# Patient Record
Sex: Male | Born: 1935 | Race: Black or African American | Hispanic: No | Marital: Married
Health system: Southern US, Community
[De-identification: ages and names within clinical notes are randomized; demographics above are authoritative.]

---

## 2005-09-25 ENCOUNTER — Other Ambulatory Visit: Payer: Self-pay

## 2005-09-25 ENCOUNTER — Observation Stay: Payer: Self-pay | Admitting: Internal Medicine

## 2005-09-26 ENCOUNTER — Other Ambulatory Visit: Payer: Self-pay

## 2005-10-06 ENCOUNTER — Emergency Department: Payer: Self-pay | Admitting: Emergency Medicine

## 2006-08-23 ENCOUNTER — Emergency Department: Payer: Self-pay | Admitting: Emergency Medicine

## 2006-08-23 ENCOUNTER — Other Ambulatory Visit: Payer: Self-pay

## 2008-02-07 ENCOUNTER — Inpatient Hospital Stay: Payer: Self-pay | Admitting: Internal Medicine

## 2008-02-07 ENCOUNTER — Other Ambulatory Visit: Payer: Self-pay

## 2008-03-24 ENCOUNTER — Ambulatory Visit: Payer: Self-pay | Admitting: Family Medicine

## 2009-07-15 ENCOUNTER — Ambulatory Visit: Payer: Self-pay | Admitting: Family Medicine

## 2010-01-21 ENCOUNTER — Inpatient Hospital Stay: Payer: Self-pay | Admitting: Specialist

## 2010-07-09 ENCOUNTER — Ambulatory Visit: Payer: Self-pay | Admitting: Family Medicine

## 2010-12-14 ENCOUNTER — Inpatient Hospital Stay: Payer: Self-pay | Admitting: Internal Medicine

## 2012-03-06 ENCOUNTER — Ambulatory Visit: Payer: Self-pay | Admitting: Family Medicine

## 2012-04-13 ENCOUNTER — Inpatient Hospital Stay: Payer: Self-pay | Admitting: Internal Medicine

## 2012-04-13 LAB — TROPONIN I: Troponin-I: 0.07 ng/mL — ABNORMAL HIGH

## 2012-04-13 LAB — URINALYSIS, COMPLETE
Bacteria: NONE SEEN
Bilirubin,UR: NEGATIVE
Nitrite: NEGATIVE
RBC,UR: 1092 /HPF (ref 0–5)
Specific Gravity: 1.022 (ref 1.003–1.030)
Squamous Epithelial: NONE SEEN
WBC UR: 42 /HPF (ref 0–5)

## 2012-04-13 LAB — COMPREHENSIVE METABOLIC PANEL
Albumin: 3 g/dL — ABNORMAL LOW (ref 3.4–5.0)
Anion Gap: 11 (ref 7–16)
Calcium, Total: 10.2 mg/dL — ABNORMAL HIGH (ref 8.5–10.1)
Chloride: 109 mmol/L — ABNORMAL HIGH (ref 98–107)
Co2: 29 mmol/L (ref 21–32)
EGFR (Non-African Amer.): 16 — ABNORMAL LOW
Glucose: 581 mg/dL (ref 65–99)
Osmolality: 350 (ref 275–301)
Potassium: 4.3 mmol/L (ref 3.5–5.1)
Sodium: 149 mmol/L — ABNORMAL HIGH (ref 136–145)
Total Protein: 7.8 g/dL (ref 6.4–8.2)

## 2012-04-13 LAB — CBC
HCT: 51.5 % (ref 40.0–52.0)
MCH: 26.9 pg (ref 26.0–34.0)
MCHC: 29.1 g/dL — ABNORMAL LOW (ref 32.0–36.0)
MCV: 92 fL (ref 80–100)
RBC: 5.57 10*6/uL (ref 4.40–5.90)
RDW: 14.2 % (ref 11.5–14.5)

## 2012-04-13 LAB — BASIC METABOLIC PANEL
Anion Gap: 8 (ref 7–16)
Calcium, Total: 9.6 mg/dL (ref 8.5–10.1)
Chloride: 114 mmol/L — ABNORMAL HIGH (ref 98–107)
EGFR (African American): 22 — ABNORMAL LOW
EGFR (Non-African Amer.): 19 — ABNORMAL LOW
Potassium: 4 mmol/L (ref 3.5–5.1)
Sodium: 152 mmol/L — ABNORMAL HIGH (ref 136–145)

## 2012-04-13 LAB — CK TOTAL AND CKMB (NOT AT ARMC): CK, Total: 208 U/L (ref 35–232)

## 2012-04-14 LAB — CBC WITH DIFFERENTIAL/PLATELET
Basophil #: 0.1 10*3/uL (ref 0.0–0.1)
Basophil %: 0.9 %
Eosinophil #: 0 10*3/uL (ref 0.0–0.7)
Eosinophil %: 0.2 %
MCHC: 31 g/dL — ABNORMAL LOW (ref 32.0–36.0)
MCV: 90 fL (ref 80–100)
Monocyte #: 1.1 x10 3/mm — ABNORMAL HIGH (ref 0.2–1.0)
Monocyte %: 7.7 %
Neutrophil #: 10.7 10*3/uL — ABNORMAL HIGH (ref 1.4–6.5)
Neutrophil %: 76.8 %
Platelet: 92 10*3/uL — ABNORMAL LOW (ref 150–440)
RBC: 4.96 10*6/uL (ref 4.40–5.90)
RDW: 13.8 % (ref 11.5–14.5)
WBC: 14 10*3/uL — ABNORMAL HIGH (ref 3.8–10.6)

## 2012-04-14 LAB — BASIC METABOLIC PANEL
Calcium, Total: 9.2 mg/dL (ref 8.5–10.1)
Chloride: 117 mmol/L — ABNORMAL HIGH (ref 98–107)
Creatinine: 2.84 mg/dL — ABNORMAL HIGH (ref 0.60–1.30)
Glucose: 217 mg/dL — ABNORMAL HIGH (ref 65–99)
Osmolality: 342 (ref 275–301)
Sodium: 157 mmol/L — ABNORMAL HIGH (ref 136–145)

## 2012-04-14 LAB — CK TOTAL AND CKMB (NOT AT ARMC): CK, Total: 254 U/L — ABNORMAL HIGH (ref 35–232)

## 2012-04-14 LAB — MAGNESIUM: Magnesium: 2.2 mg/dL

## 2012-04-14 LAB — HEMOGLOBIN A1C: Hemoglobin A1C: 9 % — ABNORMAL HIGH (ref 4.2–6.3)

## 2012-04-14 LAB — LIPID PANEL: Cholesterol: 141 mg/dL (ref 0–200)

## 2012-04-14 LAB — TROPONIN I: Troponin-I: 0.09 ng/mL — ABNORMAL HIGH

## 2012-04-14 LAB — URINE CULTURE

## 2012-04-15 LAB — BASIC METABOLIC PANEL
Anion Gap: 9 (ref 7–16)
BUN: 75 mg/dL — ABNORMAL HIGH (ref 7–18)
Calcium, Total: 8.6 mg/dL (ref 8.5–10.1)
Co2: 26 mmol/L (ref 21–32)
Creatinine: 2.19 mg/dL — ABNORMAL HIGH (ref 0.60–1.30)
EGFR (African American): 31 — ABNORMAL LOW
Glucose: 136 mg/dL — ABNORMAL HIGH (ref 65–99)
Sodium: 148 mmol/L — ABNORMAL HIGH (ref 136–145)

## 2012-04-16 LAB — BASIC METABOLIC PANEL
Anion Gap: 10 (ref 7–16)
BUN: 66 mg/dL — ABNORMAL HIGH (ref 7–18)
Calcium, Total: 9.1 mg/dL (ref 8.5–10.1)
Co2: 28 mmol/L (ref 21–32)
Creatinine: 2.03 mg/dL — ABNORMAL HIGH (ref 0.60–1.30)
EGFR (African American): 34 — ABNORMAL LOW
Glucose: 187 mg/dL — ABNORMAL HIGH (ref 65–99)
Osmolality: 320 (ref 275–301)
Potassium: 3.9 mmol/L (ref 3.5–5.1)
Sodium: 149 mmol/L — ABNORMAL HIGH (ref 136–145)

## 2012-04-17 LAB — CBC WITH DIFFERENTIAL/PLATELET
Basophil #: 0 10*3/uL (ref 0.0–0.1)
Basophil %: 0.2 %
Eosinophil #: 0.4 10*3/uL (ref 0.0–0.7)
Eosinophil %: 4.9 %
HCT: 35.8 % — ABNORMAL LOW (ref 40.0–52.0)
Lymphocyte #: 2.1 10*3/uL (ref 1.0–3.6)
Lymphocyte %: 28.7 %
MCH: 28.1 pg (ref 26.0–34.0)
MCHC: 31.7 g/dL — ABNORMAL LOW (ref 32.0–36.0)
Monocyte #: 0.6 x10 3/mm (ref 0.2–1.0)
Monocyte %: 8.3 %
Neutrophil #: 4.2 10*3/uL (ref 1.4–6.5)
RDW: 13.7 % (ref 11.5–14.5)
WBC: 7.3 10*3/uL (ref 3.8–10.6)

## 2012-04-17 LAB — BASIC METABOLIC PANEL
Anion Gap: 9 (ref 7–16)
BUN: 57 mg/dL — ABNORMAL HIGH (ref 7–18)
Calcium, Total: 8.5 mg/dL (ref 8.5–10.1)
Chloride: 112 mmol/L — ABNORMAL HIGH (ref 98–107)
Co2: 26 mmol/L (ref 21–32)
Creatinine: 1.78 mg/dL — ABNORMAL HIGH (ref 0.60–1.30)
EGFR (Non-African Amer.): 35 — ABNORMAL LOW
Osmolality: 319 (ref 275–301)

## 2012-04-18 LAB — BASIC METABOLIC PANEL
Anion Gap: 7 (ref 7–16)
BUN: 42 mg/dL — ABNORMAL HIGH (ref 7–18)
Calcium, Total: 8.8 mg/dL (ref 8.5–10.1)
Chloride: 112 mmol/L — ABNORMAL HIGH (ref 98–107)
Co2: 25 mmol/L (ref 21–32)
Creatinine: 1.65 mg/dL — ABNORMAL HIGH (ref 0.60–1.30)
EGFR (African American): 44 — ABNORMAL LOW
EGFR (Non-African Amer.): 38 — ABNORMAL LOW
Potassium: 4.6 mmol/L (ref 3.5–5.1)

## 2012-04-23 ENCOUNTER — Emergency Department: Payer: Self-pay | Admitting: Emergency Medicine

## 2012-04-23 LAB — CBC WITH DIFFERENTIAL/PLATELET
Basophil #: 0 10*3/uL (ref 0.0–0.1)
Eosinophil %: 3 %
HGB: 13.5 g/dL (ref 13.0–18.0)
Lymphocyte #: 1.8 10*3/uL (ref 1.0–3.6)
MCH: 28.5 pg (ref 26.0–34.0)
Monocyte #: 0.7 x10 3/mm (ref 0.2–1.0)
Neutrophil %: 64 %
Platelet: 128 10*3/uL — ABNORMAL LOW (ref 150–440)
RBC: 4.74 10*6/uL (ref 4.40–5.90)

## 2012-04-23 LAB — COMPREHENSIVE METABOLIC PANEL
Alkaline Phosphatase: 124 U/L (ref 50–136)
Anion Gap: 9 (ref 7–16)
BUN: 23 mg/dL — ABNORMAL HIGH (ref 7–18)
Calcium, Total: 9.6 mg/dL (ref 8.5–10.1)
Chloride: 102 mmol/L (ref 98–107)
EGFR (African American): 50 — ABNORMAL LOW
Potassium: 4.6 mmol/L (ref 3.5–5.1)
SGOT(AST): 27 U/L (ref 15–37)
Total Protein: 7.2 g/dL (ref 6.4–8.2)

## 2012-04-23 LAB — URINALYSIS, COMPLETE
Blood: NEGATIVE
Glucose,UR: 50 mg/dL (ref 0–75)
Ketone: NEGATIVE
Leukocyte Esterase: NEGATIVE
Protein: NEGATIVE
RBC,UR: <1 /HPF (ref 0–5)
Specific Gravity: 1.006 (ref 1.003–1.030)
Squamous Epithelial: <1

## 2012-04-23 LAB — CK TOTAL AND CKMB (NOT AT ARMC)
CK, Total: 57 U/L (ref 35–232)
CK-MB: 1.9 ng/mL (ref 0.5–3.6)

## 2012-04-29 LAB — CULTURE, BLOOD (SINGLE)

## 2012-05-02 ENCOUNTER — Inpatient Hospital Stay: Payer: Self-pay | Admitting: Internal Medicine

## 2012-05-02 LAB — URINALYSIS, COMPLETE
Bilirubin,UR: NEGATIVE
Ketone: NEGATIVE
Ph: 5 (ref 4.5–8.0)
Protein: 100
RBC,UR: <1 /HPF (ref 0–5)
Specific Gravity: 1.018 (ref 1.003–1.030)
Squamous Epithelial: NONE SEEN

## 2012-05-02 LAB — COMPREHENSIVE METABOLIC PANEL
Albumin: 2.5 g/dL — ABNORMAL LOW (ref 3.4–5.0)
Alkaline Phosphatase: 131 U/L (ref 50–136)
BUN: 20 mg/dL — ABNORMAL HIGH (ref 7–18)
Calcium, Total: 10.2 mg/dL — ABNORMAL HIGH (ref 8.5–10.1)
Chloride: 108 mmol/L — ABNORMAL HIGH (ref 98–107)
Co2: 24 mmol/L (ref 21–32)
Co2: 27 mmol/L (ref 21–32)
Creatinine: 1.45 mg/dL — ABNORMAL HIGH (ref 0.60–1.30)
Creatinine: 1.57 mg/dL — ABNORMAL HIGH (ref 0.60–1.30)
EGFR (African American): 51 — ABNORMAL LOW
EGFR (African American): 47 — ABNORMAL LOW
EGFR (Non-African Amer.): 40 — ABNORMAL LOW
Glucose: 90 mg/dL (ref 65–99)
Glucose: 40 mg/dL — CL (ref 65–99)
Osmolality: 279 (ref 275–301)
Potassium: 5.3 mmol/L — ABNORMAL HIGH (ref 3.5–5.1)
Potassium: 6 mmol/L — ABNORMAL HIGH (ref 3.5–5.1)
SGOT(AST): 32 U/L (ref 15–37)
SGPT (ALT): 19 U/L
Sodium: 141 mmol/L (ref 136–145)
Total Protein: 7.5 g/dL (ref 6.4–8.2)
Total Protein: 7 g/dL (ref 6.4–8.2)

## 2012-05-02 LAB — CBC
HGB: 13.8 g/dL (ref 13.0–18.0)
MCH: 28.3 pg (ref 26.0–34.0)
WBC: 17.3 10*3/uL — ABNORMAL HIGH (ref 3.8–10.6)

## 2012-05-02 LAB — PROTIME-INR
INR: 0.9
Prothrombin Time: 12.9 secs (ref 11.5–14.7)

## 2012-05-02 LAB — APTT: Activated PTT: 28.9 secs (ref 23.6–35.9)

## 2012-05-03 LAB — BASIC METABOLIC PANEL
Anion Gap: 8 (ref 7–16)
Chloride: 108 mmol/L — ABNORMAL HIGH (ref 98–107)
Co2: 28 mmol/L (ref 21–32)
Creatinine: 1.81 mg/dL — ABNORMAL HIGH (ref 0.60–1.30)
EGFR (African American): 39 — ABNORMAL LOW
Osmolality: 292 (ref 275–301)
Potassium: 4.6 mmol/L (ref 3.5–5.1)
Sodium: 144 mmol/L (ref 136–145)

## 2012-05-03 LAB — CBC WITH DIFFERENTIAL/PLATELET
Basophil #: 0 10*3/uL (ref 0.0–0.1)
Basophil %: 0.2 %
Eosinophil #: 0.3 10*3/uL (ref 0.0–0.7)
HCT: 40.9 % (ref 40.0–52.0)
Lymphocyte #: 1 10*3/uL (ref 1.0–3.6)
MCH: 28.7 pg (ref 26.0–34.0)
MCHC: 32.1 g/dL (ref 32.0–36.0)
MCV: 89 fL (ref 80–100)
Monocyte #: 0.6 x10 3/mm (ref 0.2–1.0)
Neutrophil #: 11 10*3/uL — ABNORMAL HIGH (ref 1.4–6.5)
Neutrophil %: 85.2 %
RBC: 4.58 10*6/uL (ref 4.40–5.90)
RDW: 14.3 % (ref 11.5–14.5)

## 2012-05-04 LAB — BASIC METABOLIC PANEL
BUN: 29 mg/dL — ABNORMAL HIGH (ref 7–18)
Calcium, Total: 9.3 mg/dL (ref 8.5–10.1)
Creatinine: 1.99 mg/dL — ABNORMAL HIGH (ref 0.60–1.30)
EGFR (Non-African Amer.): 30 — ABNORMAL LOW
Osmolality: 291 (ref 275–301)
Potassium: 4.8 mmol/L (ref 3.5–5.1)
Sodium: 142 mmol/L (ref 136–145)

## 2012-05-04 LAB — CBC WITH DIFFERENTIAL/PLATELET
Basophil %: 0.4 %
Eosinophil #: 1 10*3/uL — ABNORMAL HIGH (ref 0.0–0.7)
HCT: 37.6 % — ABNORMAL LOW (ref 40.0–52.0)
HGB: 11.6 g/dL — ABNORMAL LOW (ref 13.0–18.0)
Lymphocyte %: 13.1 %
MCH: 27.9 pg (ref 26.0–34.0)
MCHC: 31 g/dL — ABNORMAL LOW (ref 32.0–36.0)
MCV: 90 fL (ref 80–100)
Monocyte #: 0.9 x10 3/mm (ref 0.2–1.0)
Monocyte %: 7.7 %
RBC: 4.17 10*6/uL — ABNORMAL LOW (ref 4.40–5.90)

## 2012-05-06 LAB — BASIC METABOLIC PANEL
Anion Gap: 8 (ref 7–16)
BUN: 20 mg/dL — ABNORMAL HIGH (ref 7–18)
Chloride: 116 mmol/L — ABNORMAL HIGH (ref 98–107)
EGFR (African American): 44 — ABNORMAL LOW
EGFR (Non-African Amer.): 38 — ABNORMAL LOW
Glucose: 132 mg/dL — ABNORMAL HIGH (ref 65–99)
Osmolality: 301 (ref 275–301)
Sodium: 149 mmol/L — ABNORMAL HIGH (ref 136–145)

## 2012-05-06 LAB — CBC WITH DIFFERENTIAL/PLATELET
Basophil #: 0 10*3/uL (ref 0.0–0.1)
Basophil %: 0.4 %
HCT: 34.5 % — ABNORMAL LOW (ref 40.0–52.0)
Lymphocyte %: 19.8 %
MCH: 28.3 pg (ref 26.0–34.0)
MCHC: 31.7 g/dL — ABNORMAL LOW (ref 32.0–36.0)
MCV: 89 fL (ref 80–100)
Neutrophil #: 3.9 10*3/uL (ref 1.4–6.5)
Neutrophil %: 55.1 %
RBC: 3.86 10*6/uL — ABNORMAL LOW (ref 4.40–5.90)
RDW: 14.2 % (ref 11.5–14.5)
WBC: 7.2 10*3/uL (ref 3.8–10.6)

## 2012-05-07 LAB — BASIC METABOLIC PANEL
Anion Gap: 7 (ref 7–16)
BUN: 15 mg/dL (ref 7–18)
Calcium, Total: 8.8 mg/dL (ref 8.5–10.1)
Chloride: 112 mmol/L — ABNORMAL HIGH (ref 98–107)
EGFR (African American): 53 — ABNORMAL LOW
EGFR (Non-African Amer.): 46 — ABNORMAL LOW
Glucose: 107 mg/dL — ABNORMAL HIGH (ref 65–99)
Osmolality: 288 (ref 275–301)

## 2012-05-08 LAB — CULTURE, BLOOD (SINGLE)

## 2012-09-21 DEATH — deceased

## 2014-02-01 IMAGING — CT CT HEAD WITHOUT CONTRAST
2 series · 15 of 30 positions shown, 19 images · non-contrast
Comparison: none

REASON FOR EXAM: answering questions slowly
COMMENTS:

[Series 2: without · axial · non-contrast · 0.44mm/px · z∈[-141,-21]mm · 13 of 30 slices shown, 17 images]
[im 3/30  brain]
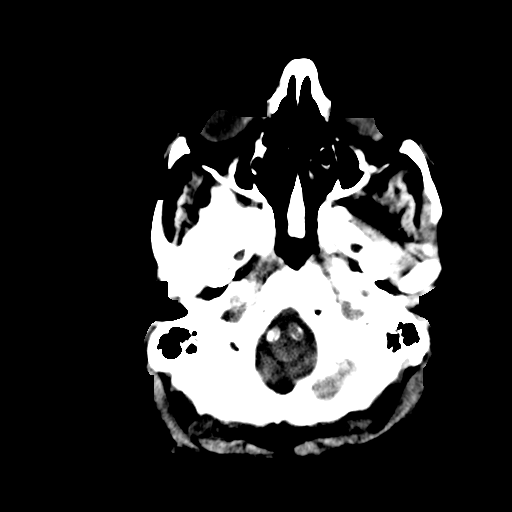
[im 3/30  bone]
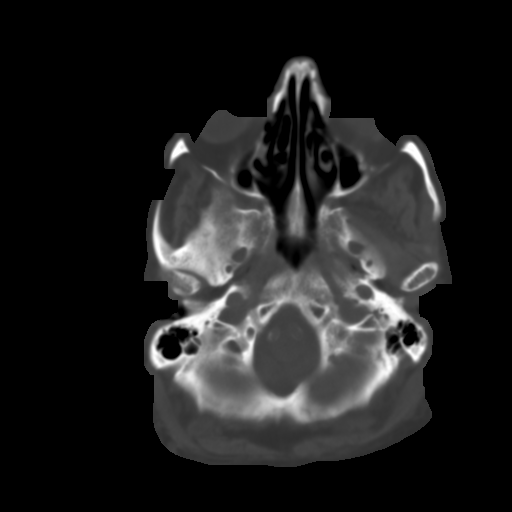
[im 5/30  brain]
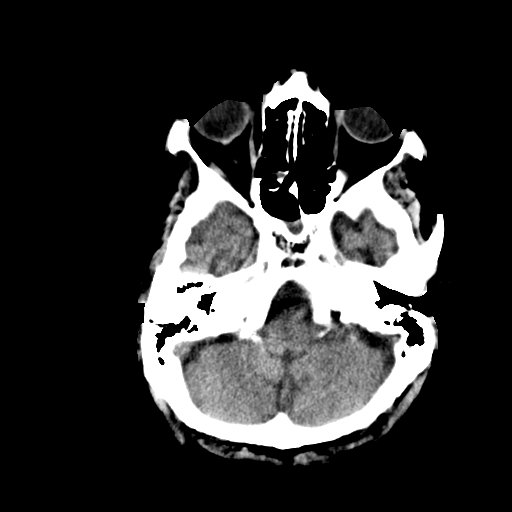
[im 7/30  brain]
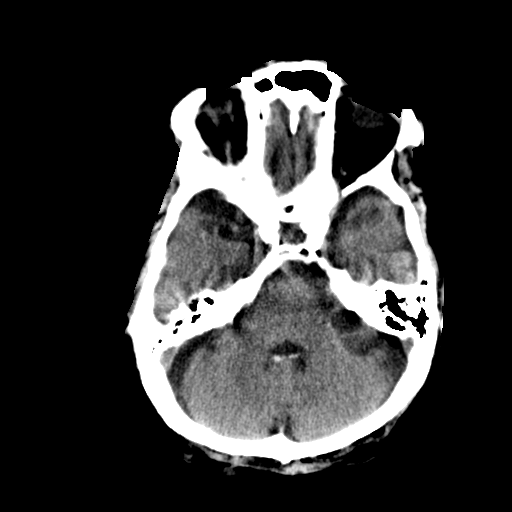
[im 9/30  brain]
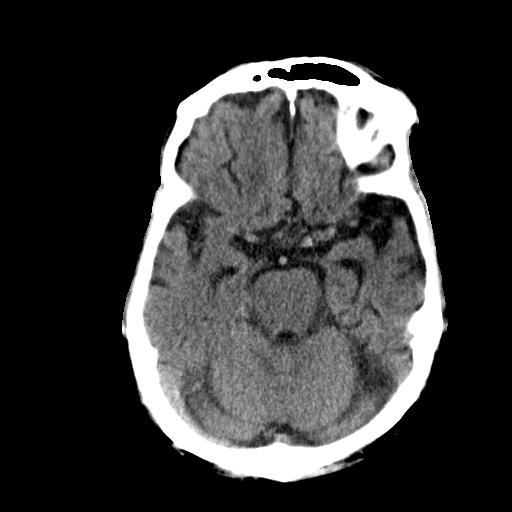
[im 11/30  brain]
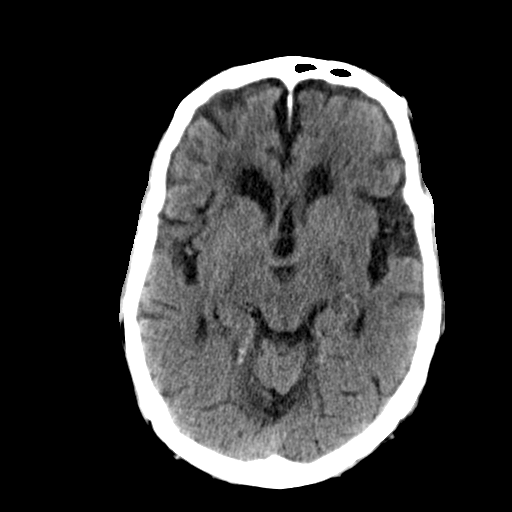
[im 11/30  bone]
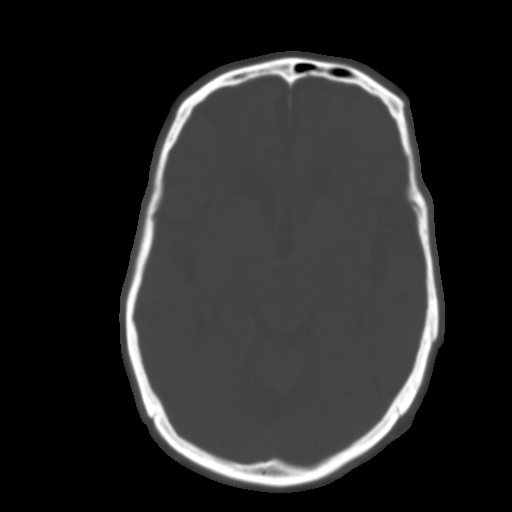
[im 13/30  brain]
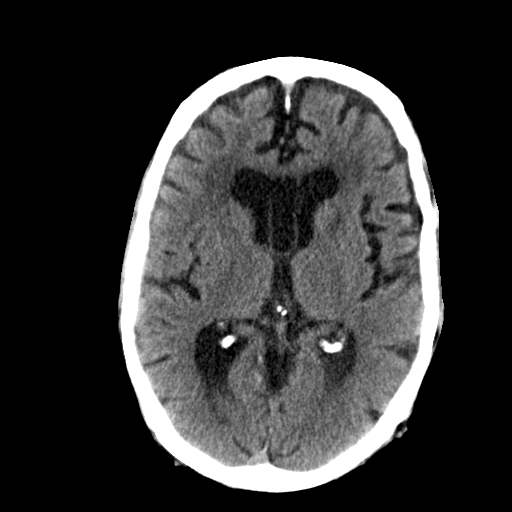
[im 15/30  brain]
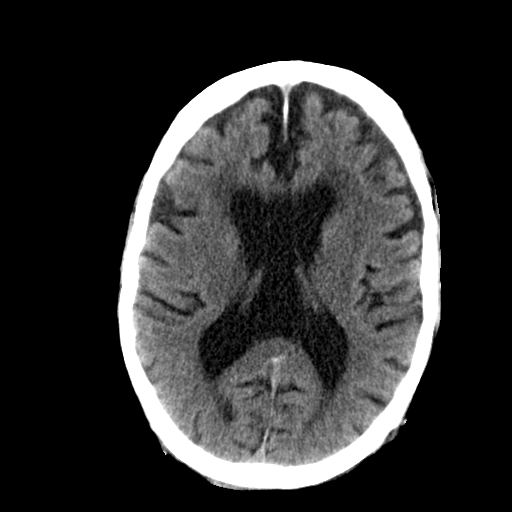
[im 17/30  brain]
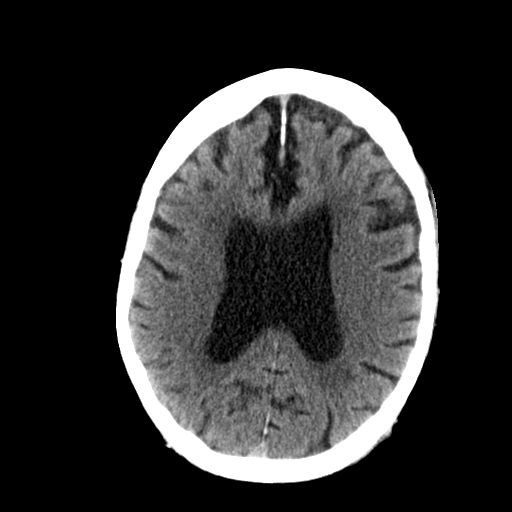
[im 19/30  brain]
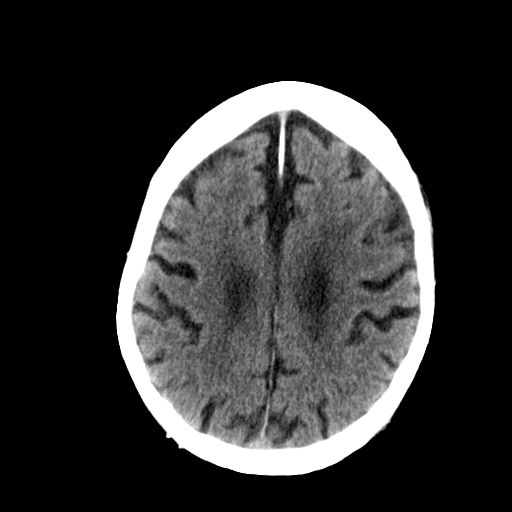
[im 19/30  bone]
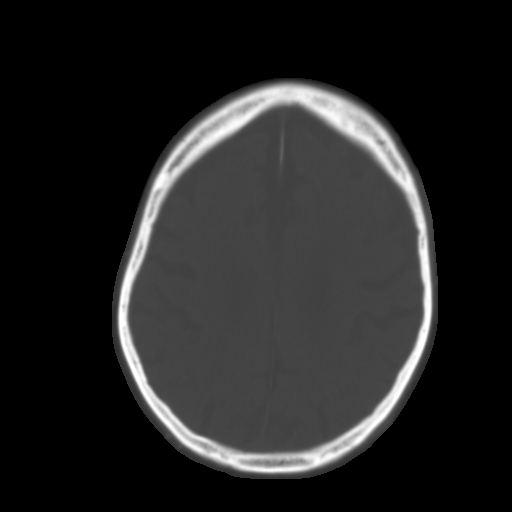
[im 21/30  brain]
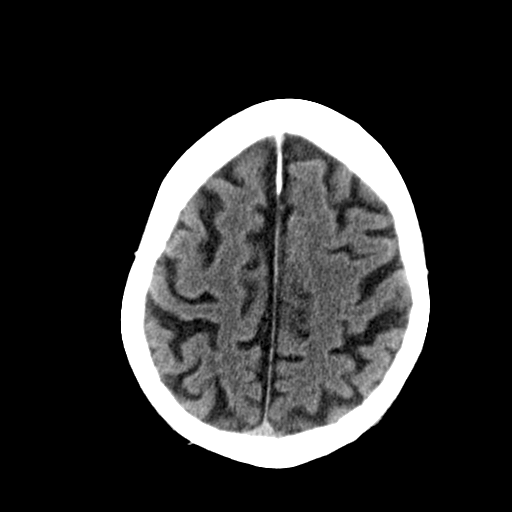
[im 23/30  brain]
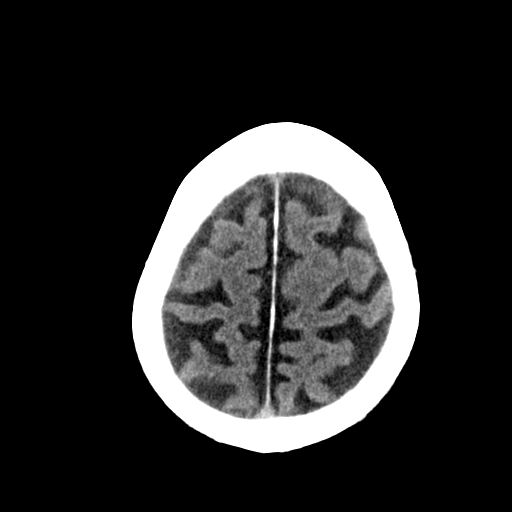
[im 25/30  brain]
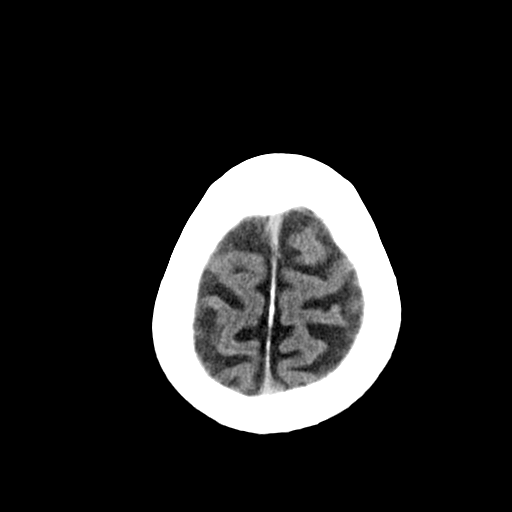
[im 27/30  brain]
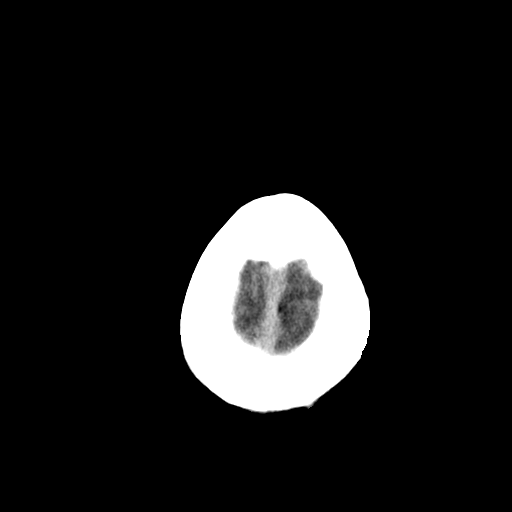
[im 27/30  bone]
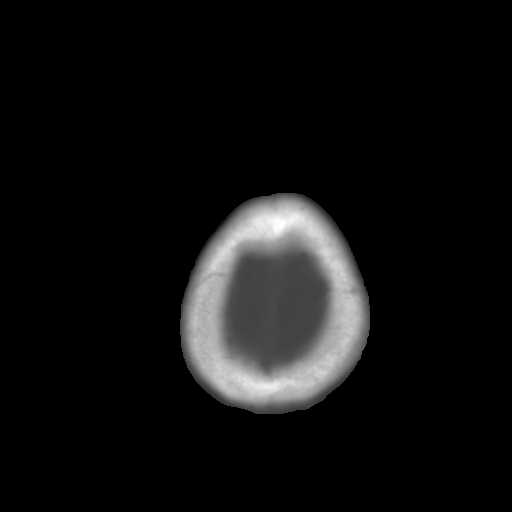

[Series 3: bone · axial · 0.44mm/px · z∈[-141,-121]mm · 2 of 30 slices shown]
[im 3/30  bone]
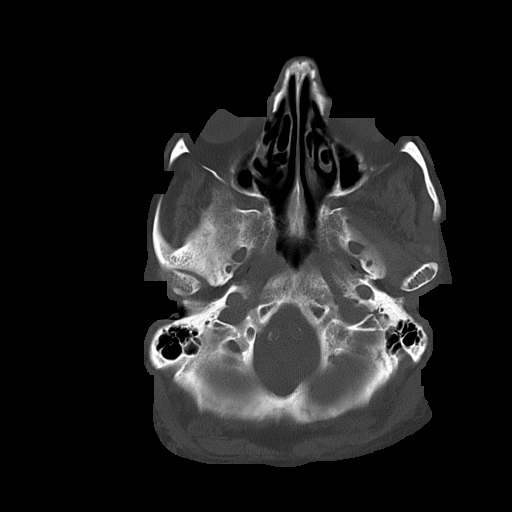
[im 7/30  bone]
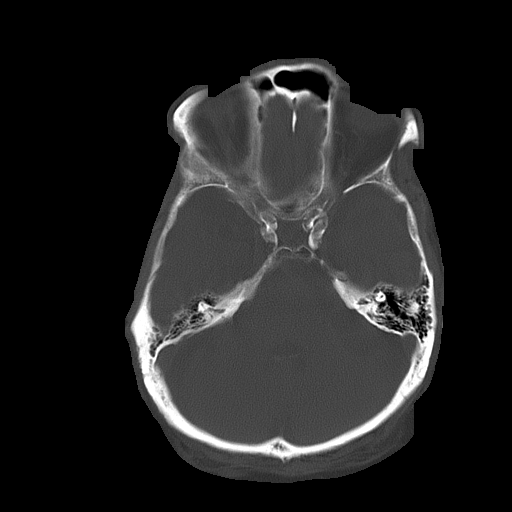

[15 of 30 positions shown; findings below may reference images not displayed]

PROCEDURE:     CT  - CT HEAD WITHOUT CONTRAST  - April 23, 2012  [DATE]

RESULT:     Axial noncontrast CT scanning was performed due to brain with
reconstructions at 5 mm intervals and slice thicknesses. Comparison is made
to study January 21, 2010.

There is moderate diffuse cerebral and cerebellar atrophy with compensatory
ventriculomegaly. There is a cavum septum pellucidum. There is no shift of
the midline. There is no evidence of an intracranial hemorrhage nor of an
evolving ischemic event.

At bone window settings there is fluid in a left sphenoid sinus cell. I see
no fluid elsewhere within the visualized portions of the sinuses. On the
previous study there is a small amount of fluid in the right sphenoid sinus
cell. The mastoid air cells are well pneumatized. There is no evidence of an
acute skull fracture.
IMPRESSION: 1. There are chronic atrophic changes within the brain. There is no evidence
of an acute ischemic or hemorrhagic event.
2. There is new fluid in a left sphenoid sinus cell which may reflect acute
sinusitis.

[REDACTED]

## 2014-02-12 IMAGING — CR DG CHEST 1V PORT
1 series · 1 of 1 positions shown · non-contrast
Comparison: none

REASON FOR EXAM: aspiration pneumonia
COMMENTS:

[portable]
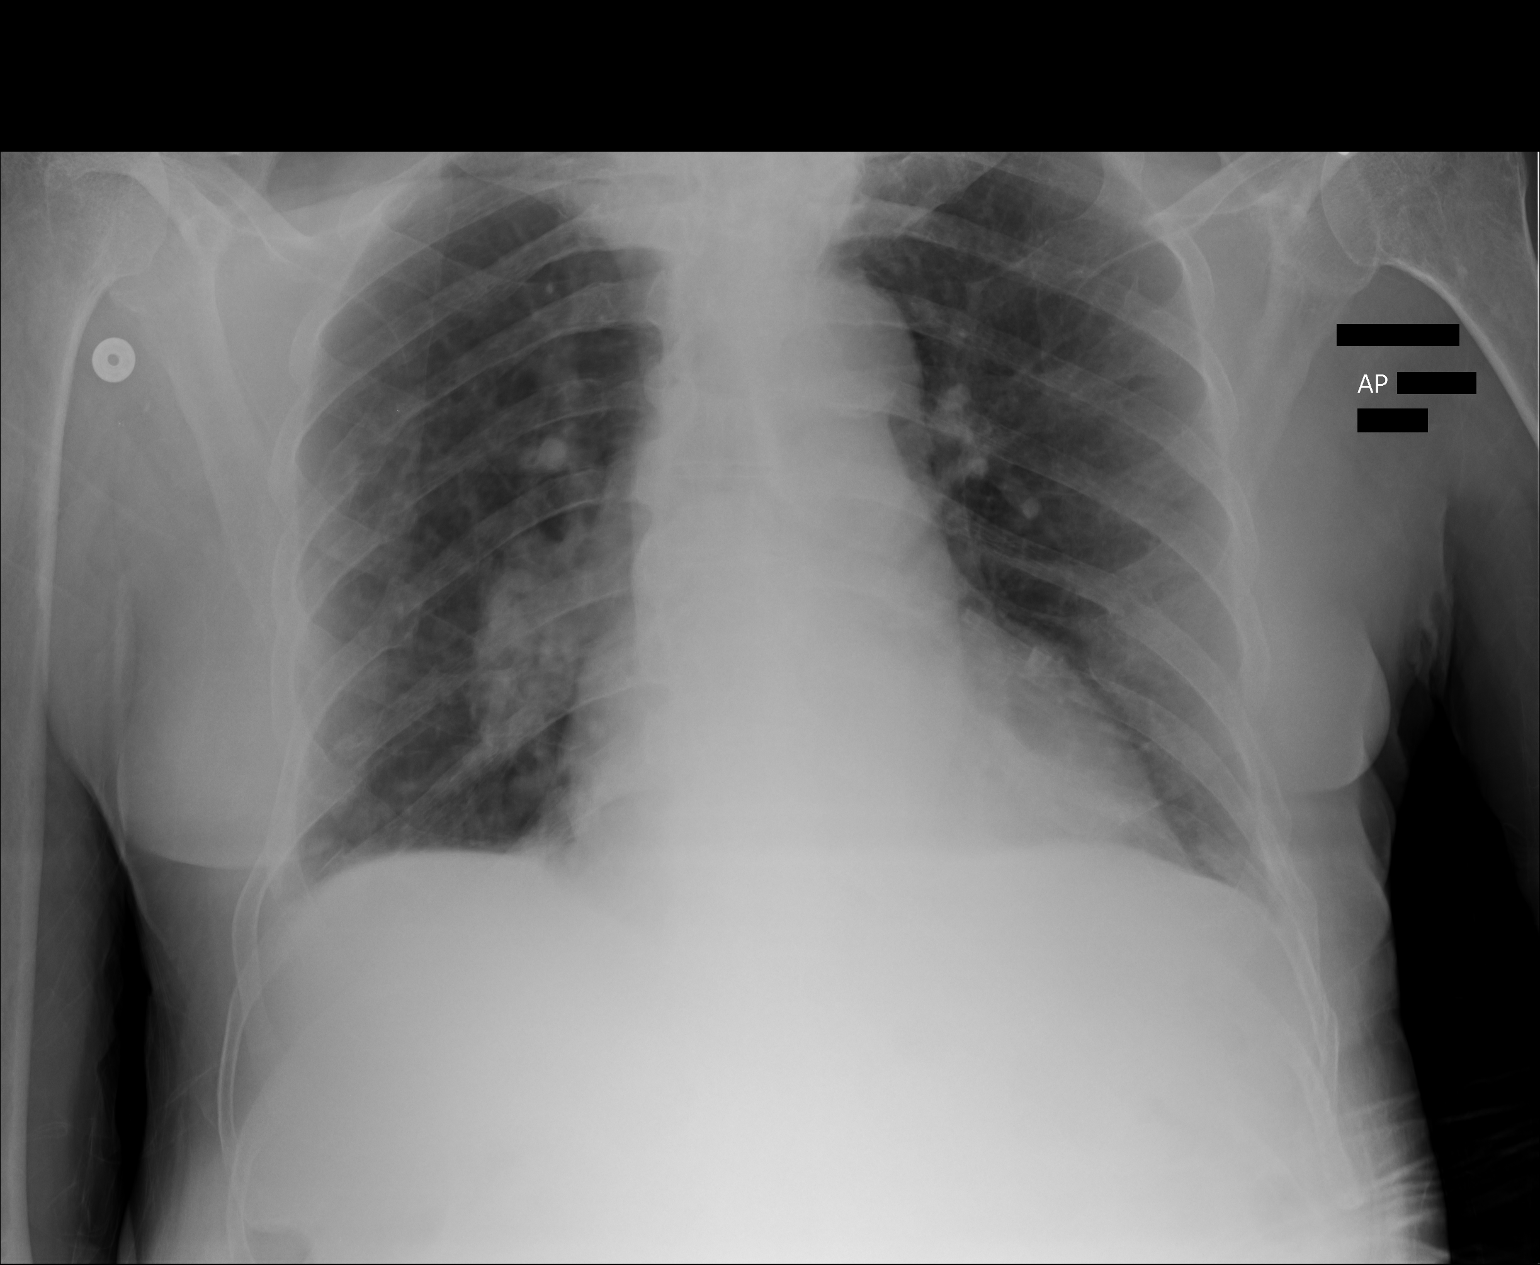

[1 of 1 positions shown; findings below may reference images not displayed]

PROCEDURE:     DXR - DXR PORTABLE CHEST SINGLE VIEW  - May 04, 2012  [DATE]

RESULT:

The patient has taken a shallow inspiration. With technique taken into
consideration, increased density projects within the right hilar region.
This study was compared to a previous study dated 05/02/2012. The prominence
in the right hilar region may be secondary to technique. Repeat two-view
chest radiograph is recommended. There is mild prominence of the
interstitial markings. The cardiac silhouette is within normal limits. The
visualized bony skeleton demonstrates multiple healed rib fractures on the
right. There also appears to be prior posterior rib fractures on the left.
IMPRESSION: 1. Shallow inspiration likely accentuating the right hilar findings. Repeat
two-view evaluation is recommended if and as clinically appropriate.
2. Chronic bilateral posterior rib fractures.
3. No new focal regions of consolidation.

## 2015-03-15 NOTE — H&P (Signed)
PATIENT NAME:  Allen Ford, Allen W MR#:  [id number] DATE OF BIRTH:  1926/11/07  DATE OF ADMISSION:  04/13/2012  PRIMARY CARE PHYSICIAN: Dorothey Basemanavid Bronstein, MD  ER PHYSICIAN: Boneta LucksJennifer Brown, MD  CHIEF COMPLAINT: Hyperglycemia.  HISTORY OF PRESENT ILLNESS: The patient is an 81 year old male patient who resides at [facility] and is followed by Dr. Terance HartBronstein who was brought in because his blood sugars are in the 600 range in the morning. So he was sent to the emergency room. The patient is a poor historian due to dementia. History is obtained from the patient's wife at the bedside and also from the ER records and old records. According to the patient's wife, he has been doing relatively okay, but for the past two to three days he has not been acting right and not eating well. Yesterday he complained of some abdominal pain. The patient cannot give any complaints, but following what I am asking he denies any chest pain, no trouble breathing, and denies any dysuria. The patient is found to have blood sugar of 581 in the ER and his creatinine is 3.28 and sodium 149. I was asked to admit the patient for hyperglycemia, acute on chronic renal failure, and urinary tract infection as well.  PAST MEDICAL HISTORY:   1. Alzheimer's dementia. 2. Hypertension. 3. Diabetes type 2. 4. History of hyponatremia, congestive heart failure, and also he was admitted last year, in January also.  5. Chronic kidney disease stage III, baseline creatinine around 2.  6. History of aspiration pneumonia before.  7. History of systolic heart failure with ejection fraction of 40%.   PAST SURGICAL HISTORY:  Hernia repair.  MEDICATIONS:  1. Abilify 5 mg p.o. twice a day.  2. Travatan one drop in each eye once a day. 3. Norvasc 2.5 mg daily. 4. Metoprolol 12.5 mg p.o. twice a day. 5. Cranberry juice one tablet twice a day. 6. Lantus 15 units at bedtime. 7. Vitamin D3 50,000 units one capsule once a month.  8. Paxil 20 mg  daily. 9. Aspirin 81 mg daily.  10. Victoza 18 mg/3 mL, 1.2 mg once a day at 9:00 a.m.   DIET: Nectar thick liquids.  ALLERGIES: No known drug allergies.   SOCIAL HISTORY: The patient is residing at [facility]. No alcohol or tobacco abuse.   FAMILY HISTORY: Significant for history of hypertension and heart disease in his family.   REVIEW OF SYSTEMS: Unobtainable due to his dementia.   PHYSICAL EXAMINATION:   VITAL SIGNS: Temperature 98.2, pulse 98, respirations 18, blood pressure 144/85, and saturation 96% on room air.  GENERAL:  Awake and oriented.   HEAD: Atraumatic, normocephalic.  EYES:  Pupils are equally reacting to light. Extraocular movements are intact.   ENT: No tympanic membrane congestion. No turbinate hypertrophy. No oropharyngeal erythema.   NECK: The patient has normal range of motion. No JVD. No carotid bruits.  HEART: S1 and S2 irregular, irregular. No murmurs. PMI not displaced.  LUNGS: Breath sounds are clear, decreased at the bases. No wheeze. No rales.   ABDOMEN: Slightly distended. Bowel sounds are diminished. No organomegaly is appreciated. The patient has no tenderness.  EXTREMITIES: No extremity edema. No cyanosis. No clubbing.   NEUROLOGIC: No focal neurological deficits. Cranial nerves II through XII are intact. Power 5/5 in upper and lower extremities. DTRs 2+ bilaterally.   NEUROLOGIC: The patient has dementia.   LABS/STUDIES:  Urinalysis: Cloudy, 3+ blood, 1+ leukocyte esterase, and WBCs 42.  Chest x-ray: No evidence of  congestive heart failure or pneumonia.   WBC 13.4, hemoglobin 15, hematocrit 51, and platelets 92.   Electrolytes: Sodium 149, potassium 4.3, chloride 109, bicarbonate 29, BUN 88, creatinine 3.28, and glucose 581. Anion gap 11. Troponin 0.07.   EKG:  Atrial fibrillation 102 beats per minute and ST depressions in lead V5 and V6. The patient has history of T wave inversions in lateral leads on EKG from March 2011. At that  time he also had atrial fibrillation so this is chronic.   Baseline creatinine is around 2.51.   Echocardiogram that was done in January 2012 showed an ejection fraction of 40% with mild hypokinesia of left ventricle.  ASSESSMENT AND PLAN:  1. An 81 year old male with hyperglycemia, nonketotic state, acute on chronic renal failure and mild urinary tract infection. The patient is admitted to the Intensive Care Unit for insulin drip and IV fluids. Check BMP every eight hours. The patient probably will be able to go to the floor by tomorrow. We will continue insulin drip for today and continue fluids. Continue pureed diet with nectar thick liquids. Check Hemoglobin A1c tomorrow. Empiric antibiotics with Rocephin for possible urinary tract infection. Follow urine cultures and CBC.  2. Chronic obstructive pulmonary disease, chronic, not hypoxic. Continue Duo-Nebs and continue oxygen.  3. Chronic systolic heart failure with ejection fraction 40%. He is on metoprolol. He is on metoprolol and aspirin. Continue both of them. Avoid ACE inhibitors because of renal failure.  4. Acute on chronic renal failure. Baseline creatinine around 2.5. Gentle hydration and monitor kidney function and monitor urine output. 5. Hypernatremia. The patient probably is dehydrated. Continue normal saline. Check BMP every eight hours. If sodium worsens we will change to D5 water and continue insulin drip.  6. History of dementia. The patient is on Paxil and also Abilify, continue that.  7. The patient also is on a pureed diet, continue pureed diet.   The patient's hospital course was discussed with his wife.  TIME SPENT ON CRITICAL CARE: Approximately 60 minutes. ____________________________ Katha Hamming, MD sk:slb D: 04/13/2012 11:04:43 ET T: 04/13/2012 12:27:29 ET JOB#: [id number]  cc: Katha Hamming, MD, <Dictator> Teena Irani. [name], MD Katha Hamming MD ELECTRONICALLY SIGNED 04/24/2012 7:39

## 2015-03-15 NOTE — Consult Note (Signed)
PATIENT NAME:  Allen Ford, MAFFEO MR#:  [id number] DATE OF BIRTH:  Nov 30, 1925  DATE OF CONSULTATION:  04/17/2012  REFERRING PHYSICIAN:  Gladstone Lighter, MD  CONSULTING PHYSICIAN:  A. Lavone Orn, MD  CHIEF COMPLAINT: Uncontrolled diabetes.  HISTORY OF PRESENT ILLNESS: This is an 81 year old male with type 2 diabetes seen in consultation for uncontrolled diabetes. He was admitted on 04/13/2012 when his blood sugars were over 600. His records from his nursing facility were reviewed. It appears he was given Solu-Medrol 120 mg a day prior to admission and prednisone 30 mg a day of admission. It is not clear why he was treated with steroids. On admission, he was placed on IV insulin and then titrated to subcutaneous insulin injections. His current regimen is NovoLog 10 units t.i.d. before meals plus a NovoLog sliding scale. Lantus has been held for the last two nights. Last night he had low blood sugars into the 30s around 11:00 p.m. after receiving 11 units of NovoLog for supper. The patient is a poor historian. He cannot tell me his name or his location. He tells me he does not have an appetite, and he has chronic nausea. At midnight, D10 at 50 mL/hour was added; and today blood sugars have been in the range of 200s to 300s.   PAST MEDICAL HISTORY:  1. Type 2 diabetes.  2. Alzheimer's.  3. Hypertension.  4. Stage III chronic kidney disease.  5. Congestive heart failure.  6. History of aspiration pneumonia.   PAST SURGICAL HISTORY: Hernia repair.  CURRENT INPATIENT MEDICATIONS:  1. Abilify 2 mg b.i.d.  2. Lopressor 12.5 mg daily.  3. Paxil 20 mg daily.  4. Aspirin 81 mg daily.  5. NovoLog 10 units t.i.d. before meals.  6. Combivent every 6 hours while awake.  7. D10 at 50 mL/h.  8. Protonix 40 mg daily.   ALLERGIES: No known drug allergies.   SOCIAL HISTORY: The patient resides at [facility]. No alcohol or tobacco abuse.   FAMILY HISTORY: Positive for hypertension and  heart disease.   REVIEW OF SYSTEMS: This is difficult to obtain as the patient was frequently falling asleep during the interview; however, he did explain that he has not had headache. He denies vision changes, denies chest pain. He denies shortness of breath. He reports abdominal pain in the lower pelvis for at least the last day. He complains of nausea. He reports poor appetite. He denies emesis.   PHYSICAL EXAMINATION:  VITAL SIGNS: Temperature 97.7, pulse 66, respirations 18, blood pressure 95/59, oxygen saturation 96% on room air.   GENERAL: A thin African American male in no acute distress.   HEENT: Extraocular movements are intact. Oropharynx appears dry.   NECK: Supple without thyromegaly. No palpable thyroid nodules.   CARDIAC: No carotid bruit. Regular rate and rhythm.   PULMONARY: Coarse breath sounds throughout bilateral lung fields.   ABDOMEN: Suprapubic tenderness to palpation. Decreased bowel sounds.   EXTREMITIES: No edema is present.   GENITOURINARY: Foley catheter is present with clear urine.   MUSCULOSKELETAL: Weakness throughout. The patient was unable to lift arms off the bed. He was either unable or unwilling to move his lower extremities.   NEUROLOGICAL:  Alert but drowsy and falling asleep, not oriented to person, place or time, not cooperative.   LABORATORY DATA: Glucose 286, BUN 57, creatinine 1.78, sodium 147, chloride 112, EGFR 40, calcium 8.5, hematocrit 35.8%.   ASSESSMENT: An 81 year old male with uncontrolled type 2 diabetes with recent episode  of severe hypoglycemia.   RECOMMENDATIONS:  1. We will adjust the basal and bolus insulin and sliding scale to target a blood sugar in the range of 100 to  200. Plan to give Lantus 15 units tonight and NovoLog 5 units t.i.d. a.c. We will adjust the sliding scale to target a blood sugar of 200.  2. Discontinue IV dextrose.  3. Avoid steroids, if possible.   Thank you for the kind request for consultation. I  will follow along with you.   ____________________________ A. Lavone Orn, MD ams:cbb D: 04/17/2012 17:30:40 ET T: 04/18/2012 10:23:17 ET JOB#: [id number]  cc: A. Lavone Orn, MD, <Dictator> Sherlon Handing MD ELECTRONICALLY SIGNED 04/25/2012 10:34

## 2015-03-15 NOTE — Consult Note (Signed)
PATIENT NAME:  Allen Ford, Allen Ford MR#:  [id number] DATE OF BIRTH:  Jun 07, 1926  DATE OF CONSULTATION:  05/03/2012  REFERRING PHYSICIAN:  Dr. Rubye Oaks  CONSULTING PHYSICIAN:  Marcina Millard, MD  PRIMARY CARE PHYSICIAN: Dr. [name]   CHIEF COMPLAINT: Hip fracture.   HISTORY OF PRESENT ILLNESS: Patient is an 81 year old gentleman referred for preoperative cardiovascular evaluation prior to hip surgery. Patient is a resident at [facility] skilled nursing facility who was brought to the Emergency Room following a left hip fracture. Patient has multiple comorbidities including atrial fibrillation, chronic kidney disease, diabetes and Alzheimer's dementia. Patient has a left hip fracture and is referred for preoperative cardiovascular evaluation. Patient is demented and is unable to give a history. The patient gives no apparent recurrent history for chest pain. Admission labs were notable for mildly elevated BUN and creatinine at 20 and 1.45, respectively. Troponin was 0.2. Patient has elevated white count of 17,300. EKG revealed atrial flutter with 4:1 conduction.   PAST MEDICAL HISTORY:  1. Atrial fibrillation.  2. 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  3. Hypertension.  4. Type 2 diabetes.  5. Chronic obstructive pulmonary disease.  6. Stage III chronic kidney disease.  7. History of Alzheimer's dementia   MEDICATIONS ON ADMISSION:  1. Aspirin 81 mg daily.  2. Amlodipine 2.5 mg daily.  3. Zofran ODT 4 mg q.6 p.r.n.  4. Vitamin D3 50,000 international units monthly.  5. Tylenol 325 mg 2 tabs p.o. every six hours p.r.n.  6. Travatan eyedrops, one to each eye at bedtime. 7. Percocet 5/325, 2 tabs daily. 8. Paxil 20 mg daily.  9. Omeprazole 20 mg daily.  10. NovoLog 5 units sub-Q t.i.d. p.r.n.  11. Novolin R sliding scale b.i.d.  12. Multivite 1 daily.  13. MiraLax 17 grams p.o. daily. 14. Lantus 20 units sub-Q at bedtime.  15. DuoNebs 1 q.6. 16. Cranberry oral  tablet 450 mg daily.  17. Abilify 2 mg b.i.d.   SOCIAL HISTORY: Patient is a resident at [facility] skilled nursing facility. Patient has no prior history of tobacco or EtOH abuse.   FAMILY HISTORY: No immediate family history of coronary artery disease or myocardial infarction.   REVIEW OF SYSTEMS: Not obtainable due to underlying dementia.   PHYSICAL EXAMINATION:  VITAL SIGNS: Blood pressure 120/73, pulse 73, respirations 18, temperature 98.1, pulse oximetry 92%.   HEENT: Pupils equal and reactive to light and accommodation.   NECK: Supple without thyromegaly.   LUNGS: Clear.   CARDIOVASCULAR: Normal jugular venous pressure. Normal point of maximal impulse. Bradycardia. Normal S1, S2. No appreciable gallop, murmur, or rub.   ABDOMEN: Soft and nontender. Pulses were 2+ bilateral.   NEUROLOGIC: Patient was alert but unable to respond to questions. Patient has severe dementia. Motor and sensory appear to be grossly intact.   IMPRESSION: 81 year old gentleman with severe Alzheimer's dementia who presents with left hip fracture. Patient is scheduled for intermediate risk surgery with several high risk factors including chronic kidney disease, diabetes, questionable history of congestive heart failure. Patient is likely at moderate risk for serious cardiovascular complication.   RECOMMENDATIONS:  1. Agree with current medical therapy.  2. No indication for temporary pacemaker.  3. Would defer pre-, perioperative or postoperative beta blocker since patient has questionable history of AV dissociation in the past.  4. Consider 2-D echocardiogram to evaluate left ventricular function if not done recently.  ____________________________ Marcina Millard, MD ap:cms D: 05/03/2012 16:52:42 ET T: 05/04/2012 07:25:25 ET JOB#: [id number]  cc:  Marcina MillardAlexander Achol Azpeitia, MD, <Dictator> Marcina MillardALEXANDER Nigeria Lasseter MD ELECTRONICALLY SIGNED 05/31/2012 8:34

## 2015-03-15 NOTE — Discharge Summary (Signed)
PATIENT NAME:  Allen Ford, Allen W MR#:  [id number] DATE OF BIRTH:  1925-11-30  DATE OF ADMISSION:  05/02/2012 DATE OF DISCHARGE:  05/07/2012  ADMITTING DIAGNOSIS: Femoral hip fracture.   DISCHARGE DIAGNOSES:  1. Femoral hip fracture not operative due to severe postop risks.  2. Possible aspiration pneumonia with hypoxia.  3. History of chronic systolic heart failure, ejection fraction 40 to 45%.  4. Hypercalcemia, resolved.  5. History of hypertension.  6. Type 2 diabetes. 7. History of chronic obstructive pulmonary disease.  8. Chronic kidney disease stage III. 9. Alzheimer's depression and dementia.   CONSULTS:  Dr. Cordelia PocheParaschos  HOSPITAL COURSE:  This is an 81 year old male who was initially admitted to the orthopedic service for hip fracture. The family opted not to perform surgery due to high risk of cardiac arrest, so the patient was transferred to the hospitalist service. The patient is being discharged to rehab today.  1. Possible aspiration pneumonia with hypoxia on presentation. Blood cultures negative to date. He was on IV Zosyn and aspiration precautions.  He was changed to Augmentin at discharge. 2. Leukocytosis, stress induced versus aspiration, which has improved.  3. History of  chronic systolic heart failure, ejection fraction of 40 to 45%, currently well compensated, not on diuretics at baseline and would not start one due to renal disease.  4. Hypercalcemia, medication induced or dehydration, which has improved with IV fluids.  5. History of hypertension. The patient's blood pressure is low normal.  6. Diabetes. The patient's blood sugars were well controlled.  7. History of chronic obstructive pulmonary disease, which is stable.  8. Stage III chronic kidney disease. The patient's creatinine remained at baseline.  9. Alzheimer's dementia and depression, on Paxil and Abilify.  10. Left hip fracture after a fall. High risk candidate. The family did not want surgery. The  patient will be discharged on Lovenox and pain medications to rehab.   DISCHARGE MEDICATIONS: 1. Omeprazole 20 mg daily.  2. MiraLAX 17 grams in 8 ounces at 8:00 a.m.  3. Multivitamin with iron 1 tablet daily.  4. Norvasc 2.5 mg daily.  5. Paxil 20 mg daily.  6. Vitamin D3 50,000 international units monthly on the first.  7. Aspirin 81 mg daily.  8. Abilify 2 mg b.i.d.  9. Travatan 0.004% to each eye daily.  10. Tylenol 325, 2 tablets every six hours p.r.n. pain.  11. Cranberry oral tablet 450 mg daily.  12. Novolin sliding scale. 13. Lantus 20 units at bedtime.  14. DuoNebs 3 mL q. 6 hours.  15. Percocet 5/325, 1 to 2 tablets q. 4-6 h. p.r.n. pain.  16. Magnesium hydroxide 30 mL twice a day p.r.n.  17. Bisacodyl 10-mg suppository p.r.n.  18. Augmentin 875 q.12 h.  19. Lovenox 40 mg subcutaneous daily for 30 days.   DISCHARGE DIET: Mechanical soft, aspiration precautions.  DISCHARGE ACTIVITY:  As tolerated. Bedbound.  DISCHARGE REFERRAL:  None.   DISCHARGE FOLLOWUP: The patient can follow up with the physician at the skilled nursing facility in two weeks.    TIME SPENT: 35 minutes. ____________________________ Janyth ContesSital P. Juliene PinaMody, MD spm:bjt D:  05/07/2012 13:50:19 ET          T: 05/07/2012 14:00:45 ET          JOB#: [id number]  cc: Teena Iraniavid M. Terance HartBronstein, MD Janyth ContesSITAL P Ruger Saxer MD ELECTRONICALLY SIGNED 05/07/2012 14:20

## 2015-03-15 NOTE — Consult Note (Signed)
             Electronic Signatures: Camillo FlamingLateef, Nivaan Dicenzo (MD)  (Signed 12-Jun-13 14:26)  Authored: Brief Consult Note   Last Updated: 12-Jun-13 14:26 by Camillo FlamingLateef, Kodi Guerrera (MD)

## 2015-03-15 NOTE — Discharge Summary (Signed)
PATIENT NAME:  Allen Ford, Allen Ford MR#:  [id number] DATE OF BIRTH:  Mar 02, 1926  DATE OF ADMISSION:  04/13/2012 DATE OF DISCHARGE:  04/19/2012  ADMITTING PHYSICIAN: Dr. Luberta MutterKonidena DISCHARGING PHYSICIAN: Dr. Enid Baasadhika Ananda Caya    PRIMARY CARE PHYSICIAN: Dr. Dorothey Basemanavid Bronstein   CONSULTATIONS IN THE HOSPITAL: Endocrinology consultation by Dr. Carlena SaxAnna Solum.   DISCHARGE DIAGNOSES:  1. Hyperglycemic, hyperosmolar, nonketotic coma on admission.  2. Hyperglycemic episodes while in the hospital.  3. Brittle diabetes.  4. Hypertension.  5. Alzheimer's dementia.  6. Acute renal failure.  7. Chronic kidney disease, stage III, baseline creatinine of 2.  8. Hypernatremia on admission, now corrected.  9. Chronic obstructive pulmonary disease.  10. AV dissociation  rhythm with chronic paroxysmal atrial fibrillation.  11. Congestive heart failure with systolic dysfunction, ejection fraction of around 45%, well compensated currently.  12. Elevated troponin on admission.  13. Depression.   DISCHARGE MEDICATIONS:  1. Omeprazole 20 mg p.o. daily.  2. MiraLAX powder p.r.n. for constipation.  3. Multivitamin 1 tablet p.o. daily.  4. Norvasc 2.5 mg p.o. daily.  5. Paxil 20 mg p.o. daily.  6. Vitamin D3 50,000 international units once a month.  7. Aspirin 81 mg p.o. daily.  8. Abilify 2 mg p.o. b.i.d.  9. Travatan ophthalmic solution, one drop each eye once a day.  10. Tylenol 650 mg p.o. every six hours p.r.n.  11. Cranberry oral tablet 450 mg p.o. daily.  12. Novolin regular insulin sliding scale, for 200 to 250 blood sugar give 2 units, for 251 to 300 give 6 units, for 301 to 350 give 7 units, for 351 to 450 give 8 units, for greater than 450 call M.D.  13. DuoNebs 3 mL q. six hours p.r.n. for wheezing.  14. Lantus 20 units subcutaneous at bedtime.  15. Insulin aspart 5 units subcutaneous 3 times daily prior to meals.  Patient advised not to take metoprolol.   DISCHARGE DIET: Low sodium, ADA diet.    DISCHARGE ACTIVITY: As tolerated.   HOME OXYGEN: None.   FOLLOWUP INSTRUCTIONS:  1. PCP followup in 1 to 2 weeks.  2. Increased p.o. water intake.  3. Physical therapy as tolerated.   LABS AND IMAGING STUDIES: Sodium 144, potassium 4.6, chloride 112, bicarbonate 25, BUN 42, creatinine 1.65. Glucose 287, calcium 8.8. WBC 7.3, hemoglobin 11.3, hematocrit 35.8, platelet count 79. Renal function on admission: Creatinine was 2.84 with BUN 80. Troponin slightly elevated at 0.07. Glucose on admission was greater than 580. ALT 27, AST 19, alkaline phosphatase 138, total bilirubin 1.1, albumin 3. 1+ leukocyte esterase, WBCs 42 on urinalysis with no bacteria seen and cultures are negative. Blood cultures remained negative.   BRIEF HOSPITAL COURSE: Mr. Allen Ford is an 81 year old gentleman with past medical history significant for dementia, hypertension, diabetes, and chronic kidney disease who is from [facility], brought in for elevated blood sugars in the 600s. The patient is a very poor historian due to dementia and was found to have a sugar of 518 in the ED with creatinine of 3.28 and hypernatremia with sodium 149.   1. Hyperosmolar, hyperglycemic, nonketotic coma. He was initially admitted to the Intensive Care Unit for insulin drip and IV fluids were continued. His hemoglobin A1c was found to be 9. Apparently it seems like the patient has been on Lantus at [facility] but he was recently started on steroids, precipitating his hyperglycemia. However, after starting him on steroids he was found to have a few episodes of hypoglycemia with sugars as  low as in the 30s. Talking to his wife, it seems like he does have history of brittle diabetes with frequent episodes of hypo- and hyperglycemia.  He was seen by endocrinologist Dr. Tedd Sias who adjusted his Lantus regimen while monitoring his glucose levels closely. He is being discharged currently on 20 units of Lantus at bedtime and aspart insulin 5 units  subcutaneous 3 times a day prior to meals.  2. Hypernatremia, free water deficiency. He was given dextrose and his sodium has improved to 144. Because of his dementia the patient just tends to forget to drink water so he needs to be constantly encouraged to drink water.  3. A-V dissociation rhythm.  He did have questionable complete heart block rhythm on the telemetry monitor without any symptoms, his blood pressure being stable.  The EKG was seen by Dr. Kirke Corin curbside who recommended just to stop the metoprolol for now. The heart rate needs to be monitored. If the patient bradys down, he might need a pacemaker, but not at this time.  It is actually an atrioventricular dissociation rhythm rather than complete heart block because of the heart rate and junctional nature of the rhythm.  The metoprolol was stopped.  4. Acute renal failure. His renal function improved with IV fluids and creatinine is 1.65 at the time of discharge. The patient has known history of chronic kidney disease with baseline creatinine around 2. His course has been otherwise uneventful while in the hospital. His wife has been updated about the discharge and the patient will be discharged back to [facility].   DISCHARGE CONDITION: Stable.   DISCHARGE DISPOSITION: [facility] skilled nursing facility.   TIME SPENT ON DISCHARGE: 45 minutes.  ____________________________ Enid Baas, MD rk:bjt D: 04/19/2012 13:52:00 ET T: 04/19/2012 14:20:02 ET JOB#: [id number]  cc: Teena Irani. [name], MD Enid Baas, MD, <Dictator> Enid Baas MD ELECTRONICALLY SIGNED 04/23/2012 10:14

## 2015-03-15 NOTE — Consult Note (Signed)
PATIENT NAME:  Allen Ford, Allen Ford MR#:  [id number] DATE OF BIRTH:  1926/06/19  DATE OF CONSULTATION:  05/02/2012  REFERRING PHYSICIAN:  Dr. Lorenso Courier CONSULTING PHYSICIAN:  Elon Alas, MD  PRIMARY CARE PHYSICIAN: Dr. [name]  REASON FOR CONSULTATION:  Preoperative medical evaluation.  HISTORY OF PRESENT ILLNESS:  The patient is an 81 year old male who is a resident of [facility] skilled nursing facility and unfortunately sustained a fall with resultant left hip fracture for which he was brought to the ER. Medical consultation was requested in regards to preoperative medical evaluation. He does have a cardiac history with chronic systolic congestive heart failure with ejection fraction of 40 to 45% and history of chronic atrial fibrillation as well as history of A-V dissociation and bradycardic spells for which his beta blocker was stopped. He also has hypertension, diabetes mellitus, chronic kidney disease stage III, Alzheimer's dementia, and history of aspiration pneumonia. He is mostly nonverbal at baseline and is a poor historian secondary to dementia. Therefore, most of the history was obtained by speaking with the patient's wife and from ER chart review and speaking with the ER physician. He is mostly sedentary at baseline per his wife. In the ER today noncontrast head CT and CT of the C-spine were obtained, both of which did not reveal any acute abnormality. Chest x-ray reveals prominent lung markings which can be seen with fibrosis or interstitial pneumonitis versus edema. Left hip x-ray reveals a subcapital fracture of the proximal left femur. Renal function appears to be at baseline with creatinine of 1.45. His baseline creatinine is at 2. He is mildly hyperkalemic and hypercalcemic. He has leukocytosis with WBC count of 17.3 but is afebrile. No further history is obtainable from the patient.   PAST SURGICAL HISTORY: Hernia repair.   PAST MEDICAL HISTORY:  1. Hypertension.  2. Type 2  diabetes mellitus with brittle diabetes mellitus with episodes of hypo- and hyperglycemia.  3. Chronic systolic congestive heart failure with ejection fraction of 40 to 45%.  4. History of A-V dissociation rhythm with bradycardic spells.  5. History of atrial fibrillation.  6. Chronic obstructive pulmonary disease.  7. Stage III chronic kidney disease, baseline creatinine around 2.  8. History of Alzheimer's dementia.  9. Brittle diabetes.  10. History of hyperosmolar nonketotic coma.  11. History of elevated troponin.  12. Depression.   ALLERGIES: No known drug allergies.     MEDICATIONS: 1. Zofran ODT 4 mg orally every six hours p.r.n.  2. Vitamin D3 50,000 international units orally monthly.  3. Tylenol p.r.n.  4. Tylenol 325 mg 2 tablets p.o. every six hours p.r.n.  5. Travatan Z 0.004%, one drop to each eye at bedtime. 6. Percocet 5/325, 2 tablets p.o. daily.  7. Paxil 20 mg daily.  8. Omeprazole 20 mg daily.  9. NovoLog 5 units subcutaneously t.i.d. with meals.  10. Novolin R sliding scale insulin b.i.d.  11. Multivitamin with iron 1 tablet daily.  12. MiraLAX 17 grams p.o. daily.  13. Lantus 20 units subcutaneously at bedtime.  14. DuoNebs one dose inhaled every six hours.  15. Cranberry oral tablet, 450 mg p.o. daily.  16. Aspirin 81 mg daily.  17. Amlodipine 2.5 mg daily.  18. Abilify 2 mg p.o. b.i.d.   FAMILY HISTORY: Significant for hypertension and unspecified heart disease per old chart review.  SOCIAL HISTORY: Negative for tobacco, alcohol, or illicit drug use per old chart review. The patient is a resident of [facility] skilled nursing facility and  comes in accompanied by his wife today.   REVIEW OF SYSTEMS: Unobtainable from the patient due to his underlying dementia. He is mostly nonverbal at baseline.   PHYSICAL EXAMINATION:  VITAL SIGNS: Temperature 98.6, pulse 70, blood pressure 108/66, respirations 18, oxygen sat 91% on room air, 100% on 1.5 liters  nasal cannula.   GENERAL: The patient is alert and oriented, not acutely distressed.   HEENT: Normocephalic. He has an abrasion about the left eye with mild bruising. No active bleeding. Pupils equal, round, reactive to light and accommodation. Extraocular muscles are intact. Anicteric sclerae. Conjunctivae pink. Hearing appears to be intact to voice. Nares without drainage. Oral mucosa is quite dry but without any lesions noted.   NECK: Supple. No jugular venous distention, lymphadenopathy, or carotid bruits bilaterally. No thyromegaly or tenderness to palpation over the thyroid gland.    LUNGS: Normal respiratory effort without use of accessory respiratory muscles. The patient has decreased breath sounds bilaterally with hyperresonance to percussion and scattered crackles with some adventitious breath sounds and some congestion noted. No rhonchi.   CARDIOVASCULAR: Irregularly irregular, non- tachycardic. No murmurs, rubs, or gallops. PMI is non- lateralized.   ABDOMEN: Soft, nontender, nondistended. Normoactive bowel sounds. No hepatosplenomegaly or palpable masses. No hernia.   EXTREMITIES: No clubbing, cyanosis, or edema. Pedal pulses are palpable bilaterally.   SKIN: No suspicious rashes. Skin turgor is good.   LYMPH: No cervical lymphadenopathy.   NEUROLOGIC: Limited exam as patient is demented. He seems to be moving all of his extremities spontaneously but not moving the left hip that much due to fracture likely. He is mostly nonverbal on my exam. Babinski sign is absent bilaterally with toes downgoing. Sensation appears to be intact. He only partially follows commands.   PSYCH: Flattened affect.     LABORATORY, DIAGNOSTIC, AND RADIOLOGICAL DATA: Noncontrast head CT: No acute intracranial abnormality. There is chronic small vessel ischemic disease. CT of the C-spine without contrast: No cervical spine fracture or static listhesis. Ligamentous injury cannot be excluded.   Chest  x-ray PA and lateral: Prominent lung markings. Findings can be consistent with fibrosis,  interstitial pneumonitis, or edema. Old healed posterior left rib fractures.  Pelvic x-ray: Subcapital left femoral fracture.   Left hip x-ray: Findings consistent with subcapital fracture proximal left femur.   Cardiac enzymes with CK 72, CK-MB is 1.7, troponin is 0.02.  INR 0.9. CBC within normal limits except for WBC elevated at 17.3.   Complete metabolic panel: Sodium 149, potassium 5.3, chloride 108, bicarbonate 24, BUN 20, creatinine 1.45, glucose 90, calcium 10.2, anion gap 9, total protein 7.5, albumin 2.7, total bilirubin 0.9, AST 32, ALT 19, alkaline phosphatase 133.   PTT 28.9. EKG with atrial flutter 4:1 conduction, 72 beats per minute with nonspecific ST and T wave changes.   ASSESSMENT AND PLAN:  81 year old male with past medical history of hypertension, diabetes mellitus, stage III chronic kidney disease, chronic systolic congestive heart failure, chronic atrial fibrillation, chronic obstructive pulmonary disease, history of aspiration pneumonia, and history of dementia here with fall and resultant left hip fracture.  1. Preoperative medical evaluation for patient planned for noncardiac surgery: The patient will be considered a moderate to high complication risk for an intermediate-risk procedure. There are no definite contraindications to surgery. EKG has been reviewed and reveals nonspecific ST and T wave changes with atrial flutter and he has history of known chronic atrial fibrillation. The patient is mostly sedentary per his wife. Difficult to elicit if he experiences  any exertional chest pain or shortness of breath, but he mostly nonexertional per his wife. I explained the risks, benefits, and alternatives of surgery to the patient's wife including bleeding, infection, thromboembolic complications such as stroke and myocardial infarction, malignant arrhythmias, cardiopulmonary arrest, as well  as death. The wife understands and accepts the risks of surgery and wishes to proceed with surgery without any further delay. Therefore I recommend proceeding with surgery as planned without any further delay. We will provide supportive care in the meanwhile. We will hold aspirin in preparation of surgery. We will gently hydrate the patient with IV fluids as he appears somewhat intravascularly volume deplete given presence of dry mucous membranes and slightly low blood pressure. He was taken off beta blockers in the recent past due to bradycardic episodes and AV dissociation rhythm. Therefore, we will not keep him on any perioperative beta blocker therapy. Provide supportive care for now and follow his overall clinical status closely. We will also be following the patient closely postoperatively.  2. Hypoxia:  The patient sounds congested and crackles were auscultated on exam. He may have possible pneumonitis on his chest x-ray. This could be possible aspiration pneumonia. He also has leukocytosis. We will keep the patient on supplemental oxygen and titrate to keep his oxygen saturations greater than 90%. We will treat suspected aspiration pneumonia as below.  3. Possible aspiration pneumonia: As above, we will obtain blood cultures and start IV antibiotics in the form of Zosyn. Keep patient on aspiration precautions. Monitor clinical response.  4. Leukocytosis: Could be stress-induced from hip fracture versus due to aspiration pneumonia. We will follow WBC count closely with antibiotics and follow up culture data and also await urinalysis.  5. History of chronic systolic congestive heart failure with ejection fraction 40 to 45%: The patient currently appears well compensated. There is no peripheral edema noted. He is not on any diuretics at baseline and would not start one acutely while he is receiving IV fluids and while he will be n.p.o. He is not on an ACE inhibitor at baseline probably due to underlying  renal impairment. We will not start one acutely. We will monitor ins and outs closely as well as daily weights. 6. Hyperkalemia: This is very mild and should correct with IV fluids. He also received a dose of Kayexalate in the ER. We will obtain a follow-up potassium level.  7. Hypercalcemia: This is very mild. This could be medication induced. We will hold his vitamin D and multivitamin (supplemented with calcium) therapy. This should also further correct with IV fluids. We will obtain a repeat serum calcium level. 8. History of hypertension: Blood pressure running low normal. We will hold Norvasc for now and gently hydrate the patient and monitor blood pressure closely.  9. History of type 2 diabetes mellitus and brittle diabetes with episodes of hyper- and hypoglycemia: We will hold Lantus while the patient will be n.p.o. and keep him on sliding scale insulin for now and monitor sugars closely.  10. Chronic obstructive pulmonary disease: Stable and without acute exacerbation. No wheezing or rhonchi auscultated on exam. We will keep the patient on supplemental oxygen since he is hypoxic. Treat him with antibiotics for suspected pneumonia and give p.r.n. bronchodilator support with DuoNebs and monitor closely.  11. Stage III chronic kidney disease with baseline creatinine around 2:  Currently renal function is better than baseline. We will continue to monitor his renal function closely while hospitalized. Avoid nephrotoxins. Also await urinalysis and monitor ins and  outs.  12. Alzheimer's dementia.  13. Depression: Continue Abilify and Paxil. 14. Left hip fracture after fall: As above, recommend proceeding with surgery as planned. This is to be further managed by orthopedics. 15. Deep vein thrombosis prophylaxis per orthopedics. 16. CODE STATUS:  LIMITED.   The patient's MOST form was sent from the skilled nursing facility and has been personally reviewed by me.  The patient wishes to receive  cardiopulmonary resuscitation, but no intubation or mechanical ventilation per his MOST form. Therefore he is considered LIMITED CODE.  17. The case has been discussed with Dr. Ernest Pine.   Thank you for this consultation and for allowing me to participate in Mr. Mcgaugh's care. We will continue to follow the patient closely in the postoperative setting as well.  TIME SPENT ON THIS CONSULTATION: Approximately 55 minutes.    ____________________________ Elon Alas, MD knl:bjt D: 05/02/2012 15:42:43 ET T: 05/02/2012 17:01:34 ET JOB#: [id number]  cc: Elon Alas, MD, <Dictator> Teena Irani. [name], MD Illene Labrador. Angie Fava., MD Elon Alas MD ELECTRONICALLY SIGNED 05/22/2012 1:23

## 2015-03-15 NOTE — H&P (Signed)
Subjective/Chief Complaint left hip pain    History of Present Illness 81 year old male resident of [facility] apparently fell last night and complained of left hip pain. No other apparent injuries. Patient has history of dementia and was unable to provide any other details. He has been a limited ambulator and has been using a wheelchair.   Past Med/Surgical Hx:  depression:   Atrial Fibrillation:   COPD:   renal failure:   CHF:   Dysphagia:   dementia:   diabetes:   HTN:   ALLERGIES:  No Known Allergies:   HOME MEDICATIONS: Medication Instructions Status  Zofran ODT 4 mg oral tablet, disintegrating 1 tab(s) SL every 6 hours PRN Nausea   Active  DuoNeb 0.5 mg-2.5 mg/3 mL inhalation solution 3 milliliter(s) inhaled every 6 hours Active  Percocet 5/325 oral tablet 2 tab(s) orally once Active  omeprazole 20 mg oral delayed release capsule 1 cap(s) orally once a day Active  MiraLax oral powder for reconstitution 17 gram(s) orally once a day in 8oz thickened liquid at 8am Active  multivitamin with iron 1 tab(s) orally once a day at 8am Active  amlodipine 2.5 mg oral tablet 1 tab(s) orally once a day at 8am Active  Paxil 20 mg oral tablet 1 tab(s) orally once a day at 8am Active  Vitamin D3 50,000 intl units oral capsule 1 tab(s) orally once a month on the 1st Active  Aspir-Low 81 mg oral enteric coated tablet 1 tab(s) orally once a day Active  Abilify 2 mg oral tablet 1 tab(s) orally 2 times a day (9a, 9p) Active  Travatan Z 0.004% ophthalmic solution 1 drop(s) to each eye once a day (at bedtime) (8p) Active  Tylenol 325 mg oral tablet 2 tab(s) orally every 6 hours, As Needed- for Fever , for Pain  Active  cranberry oral tablet 450 milligram(s) orally once a day at 8am Active  Novolin R 100 units/mL injectable solution Use as a sliding scale twice daily. 200-250= 2units 251-300= 6units 301-350= 7 units 351-450= 8units >450 Call MD Active  Lantus 20 unit(s) subcutaneous once a day  (at bedtime) Active  NovoLog 5 unit(s) subcutaneous 3 times a day ac meals Active   Family and Social History:   Family History Unable to obtain due to dementia.    Social History negative tobacco, negative ETOH    Place of Living Nursing Home  [facility]   Review of Systems:   ROS Pt not able to provide ROS   Physical Exam:   GEN well developed, well nourished    HEENT hearing intact to voice, dry oral mucosa    NECK supple    RESP no use of accessory muscles  rhonchi    CARD regular rate  no murmur  No LE edema  no JVD    ABD denies tenderness  soft    EXTR Left lower extremity is shortened and externally rotated. Pain is elicited with attempts at range of motion of the hip. No gross tenderness to palpation of the knee. No gross knee effusion.    SKIN No rashes    NEURO motor/sensory function intact    PSYCH poor insight, sedated     Assessment/Admission Diagnosis Left femoral neck fracture    Plan Long discussion with the patient's wife about the potential risks and benefits of surgical intervention, namely left hip hemiarthroplasty. The risks of nonoperative treatment were also discussed.  The patient's wife expressed understanding of the risks and  benefits and agreed with plans for surgery.  Surgical site signed as per "right site surgery" protocol.   Appreciate assistance of Medicine.   Electronic Signatures: Donato Heinz (MD)  (Signed 12-Jun-13 15:23)  Authored: CHIEF COMPLAINT and HISTORY, PAST MEDICAL/SURGIAL HISTORY, ALLERGIES, HOME MEDICATIONS, FAMILY AND SOCIAL HISTORY, REVIEW OF SYSTEMS, PHYSICAL EXAM, ASSESSMENT AND PLAN   Last Updated: 12-Jun-13 15:23 by Donato Heinz (MD)

## 2015-03-15 NOTE — Consult Note (Signed)
Brief Consult Note: Diagnosis: Pre-op, multiple high risk factors for imtermediate risk surgery. Patient probable moderate risk for CV complication.   Patient was seen by consultant.   Consult note dictated.   Comments: REC  Agree with current therapy, no definitive indication for temp ppm, defer b-blocker, consider echo if not done recently.  Electronic Signatures: Marcina MillardParaschos, Lenise Jr (MD)  (Signed 13-Jun-13 16:55)  Authored: Brief Consult Note   Last Updated: 13-Jun-13 16:55 by Marcina MillardParaschos, Edwin Baines (MD)
